# Patient Record
Sex: Female | Born: 2000 | Race: Black or African American | Hispanic: No | Marital: Single | Smoking: Never smoker
Health system: Southern US, Community
[De-identification: ages and names within clinical notes are randomized; demographics above are authoritative.]

## PROBLEM LIST (undated history)

## (undated) DIAGNOSIS — R519 Headache, unspecified: Secondary | ICD-10-CM

## (undated) HISTORY — DX: Headache, unspecified: R51.9

## (undated) HISTORY — PX: NO PAST SURGERIES: SHX2092

---
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
## 2022-01-17 ENCOUNTER — Other Ambulatory Visit: Payer: Self-pay | Admitting: Advanced Practice Midwife

## 2022-01-22 ENCOUNTER — Telehealth: Payer: Self-pay

## 2022-01-22 NOTE — Telephone Encounter (Signed)
Pt called and wanted to see if she can get a refill on her Megestrol

## 2022-01-23 ENCOUNTER — Encounter: Payer: Self-pay | Admitting: Adult Health

## 2022-01-23 ENCOUNTER — Ambulatory Visit: Payer: Medicaid Other | Admitting: Adult Health

## 2022-01-23 VITALS — BP 123/79 | HR 89 | Ht 63.0 in | Wt 216.8 lb

## 2022-01-23 DIAGNOSIS — N926 Irregular menstruation, unspecified: Secondary | ICD-10-CM

## 2022-01-23 DIAGNOSIS — Z975 Presence of (intrauterine) contraceptive device: Secondary | ICD-10-CM | POA: Diagnosis not present

## 2022-01-23 DIAGNOSIS — Z113 Encounter for screening for infections with a predominantly sexual mode of transmission: Secondary | ICD-10-CM | POA: Diagnosis not present

## 2022-01-23 MED ORDER — MEGESTROL ACETATE 40 MG PO TABS: ORAL_TABLET | ORAL | 3 refills | Status: DC

## 2022-01-23 NOTE — Progress Notes (Signed)
  Subjective:     Patient ID: Catherine Doyle, female   DOB: [birth year], 21 y.o.   MRN: [id number]  HPI Memorie is a 21 year old black female,single, G1P1 in complaining of irregular bleeding with nexplanon and request refill on megace. PCP is [facility].   Review of Systems Having irregular bleeding with nexplanon No pain with sex  Reviewed past medical,surgical, social and family history. Reviewed medications and allergies.     Objective:   Physical Exam BP 123/79 (BP Location: Right Arm, Patient Position: Sitting, Cuff Size: Normal)   Pulse 89   Ht 5\' 3"  (1.6 m)   Wt 216 lb 12.8 oz (98.3 kg)   Breastfeeding No   BMI 38.40 kg/m     Skin warm and dry. Lungs: clear to ausculation bilaterally. Cardiovascular: regular rate and rhythm.   Upstream - 01/23/22 1059       Pregnancy Intention Screening   Does the patient want to become pregnant in the next year? No    Does the patient's partner want to become pregnant in the next year? No    Would the patient like to discuss contraceptive options today? No      Contraception Wrap Up   Current Method Hormonal Implant    End Method Hormonal Implant    Contraception Counseling Provided No             Assessment:     1. Screen for STD (sexually transmitted disease) Urine sent for GC/CHL  2. Irregular bleeding Will refill megace  Meds ordered this encounter  Medications   megestrol (MEGACE) 40 MG tablet    Sig: Take 3/day (at the same time) for 5 days; 2/day for 5 days, then 1/day PO prn bleeding    Dispense:  60 tablet    Refill:  3    Order Specific Question:   Supervising Provider    Answer:   01/25/22, LUTHER H [[id number]]     3. Nexplanon in place Placed 04/21/20    Plan:     Follow up prn

## 2022-01-25 LAB — GC/CHLAMYDIA PROBE AMP
Chlamydia trachomatis, NAA: NEGATIVE
Neisseria Gonorrhoeae by PCR: NEGATIVE

## 2022-10-19 ENCOUNTER — Encounter (HOSPITAL_COMMUNITY): Payer: Self-pay | Admitting: *Deleted

## 2022-10-19 ENCOUNTER — Other Ambulatory Visit: Payer: Self-pay

## 2022-10-19 ENCOUNTER — Emergency Department (HOSPITAL_COMMUNITY): Payer: Medicaid Other

## 2022-10-19 ENCOUNTER — Emergency Department (HOSPITAL_COMMUNITY)
Admission: EM | Admit: 2022-10-19 | Discharge: 2022-10-19 | Disposition: A | Discharge status: Home / Self Care | Payer: Medicaid Other | Attending: Emergency Medicine | Admitting: Emergency Medicine

## 2022-10-19 DIAGNOSIS — Y92410 Unspecified street and highway as the place of occurrence of the external cause: Secondary | ICD-10-CM | POA: Insufficient documentation

## 2022-10-19 DIAGNOSIS — Y907 Blood alcohol level of 200-239 mg/100 ml: Secondary | ICD-10-CM | POA: Insufficient documentation

## 2022-10-19 DIAGNOSIS — R109 Unspecified abdominal pain: Secondary | ICD-10-CM | POA: Insufficient documentation

## 2022-10-19 DIAGNOSIS — M25522 Pain in left elbow: Secondary | ICD-10-CM | POA: Insufficient documentation

## 2022-10-19 DIAGNOSIS — R0781 Pleurodynia: Secondary | ICD-10-CM | POA: Diagnosis not present

## 2022-10-19 DIAGNOSIS — S0990XA Unspecified injury of head, initial encounter: Secondary | ICD-10-CM | POA: Diagnosis present

## 2022-10-19 DIAGNOSIS — Z23 Encounter for immunization: Secondary | ICD-10-CM | POA: Diagnosis not present

## 2022-10-19 DIAGNOSIS — E876 Hypokalemia: Secondary | ICD-10-CM | POA: Insufficient documentation

## 2022-10-19 DIAGNOSIS — M549 Dorsalgia, unspecified: Secondary | ICD-10-CM | POA: Diagnosis not present

## 2022-10-19 DIAGNOSIS — S0100XA Unspecified open wound of scalp, initial encounter: Secondary | ICD-10-CM | POA: Diagnosis not present

## 2022-10-19 LAB — COMPREHENSIVE METABOLIC PANEL
ALT: 76 U/L — ABNORMAL HIGH (ref 0–44)
AST: 118 U/L — ABNORMAL HIGH (ref 15–41)
Albumin: 3.8 g/dL (ref 3.5–5.0)
Alkaline Phosphatase: 85 U/L (ref 38–126)
Anion gap: 12 (ref 5–15)
BUN: 5 mg/dL — ABNORMAL LOW (ref 6–20)
CO2: 20 mmol/L — ABNORMAL LOW (ref 22–32)
Calcium: 9.1 mg/dL (ref 8.9–10.3)
Chloride: 104 mmol/L (ref 98–111)
Creatinine, Ser: 1.03 mg/dL — ABNORMAL HIGH (ref 0.44–1.00)
GFR, Estimated: >60 mL/min (ref >60)
Glucose, Bld: 135 mg/dL — ABNORMAL HIGH (ref 70–99)
Potassium: 3.1 mmol/L — ABNORMAL LOW (ref 3.5–5.1)
Sodium: 136 mmol/L (ref 135–145)
Total Bilirubin: 0.4 mg/dL (ref 0.3–1.2)
Total Protein: 8.3 g/dL — ABNORMAL HIGH (ref 6.5–8.1)

## 2022-10-19 LAB — URINALYSIS, ROUTINE W REFLEX MICROSCOPIC
Bacteria, UA: NONE SEEN
Bilirubin Urine: NEGATIVE
Glucose, UA: NEGATIVE mg/dL
Ketones, ur: NEGATIVE mg/dL
Leukocytes,Ua: NEGATIVE
Nitrite: NEGATIVE
Protein, ur: NEGATIVE mg/dL
Specific Gravity, Urine: 1.002 — ABNORMAL LOW (ref 1.005–1.030)
pH: 6 (ref 5.0–8.0)

## 2022-10-19 LAB — I-STAT CHEM 8, ED
BUN: 5 mg/dL — ABNORMAL LOW (ref 6–20)
Calcium, Ion: 1.01 mmol/L — ABNORMAL LOW (ref 1.15–1.40)
Chloride: 105 mmol/L (ref 98–111)
Creatinine, Ser: 1.3 mg/dL — ABNORMAL HIGH (ref 0.44–1.00)
Glucose, Bld: 139 mg/dL — ABNORMAL HIGH (ref 70–99)
HCT: 42 % (ref 36.0–46.0)
Hemoglobin: 14.3 g/dL (ref 12.0–15.0)
Potassium: 3.1 mmol/L — ABNORMAL LOW (ref 3.5–5.1)
Sodium: 140 mmol/L (ref 135–145)
TCO2: 21 mmol/L — ABNORMAL LOW (ref 22–32)

## 2022-10-19 LAB — CBC
HCT: 41.4 % (ref 36.0–46.0)
Hemoglobin: 14.4 g/dL (ref 12.0–15.0)
MCH: 26.4 pg (ref 26.0–34.0)
MCHC: 34.8 g/dL (ref 30.0–36.0)
MCV: 76 fL — ABNORMAL LOW (ref 80.0–100.0)
Platelets: 364 10*3/uL (ref 150–400)
RBC: 5.45 MIL/uL — ABNORMAL HIGH (ref 3.87–5.11)
RDW: 16.1 % — ABNORMAL HIGH (ref 11.5–15.5)
WBC: 11.9 10*3/uL — ABNORMAL HIGH (ref 4.0–10.5)
nRBC: 0 % (ref 0.0–0.2)

## 2022-10-19 LAB — SAMPLE TO BLOOD BANK

## 2022-10-19 LAB — PROTIME-INR
INR: 1 (ref 0.8–1.2)
Prothrombin Time: 13 seconds (ref 11.4–15.2)

## 2022-10-19 LAB — ETHANOL: Alcohol, Ethyl (B): 233 mg/dL — ABNORMAL HIGH (ref <10)

## 2022-10-19 LAB — LACTIC ACID, PLASMA
Lactic Acid, Venous: 3 mmol/L (ref 0.5–1.9)
Lactic Acid, Venous: 2.5 mmol/L (ref 0.5–1.9)

## 2022-10-19 LAB — I-STAT BETA HCG BLOOD, ED (MC, WL, AP ONLY): I-stat hCG, quantitative: <5 m[IU]/mL (ref <5)

## 2022-10-19 MED ORDER — IOHEXOL 350 MG/ML SOLN
75.0000 mL | Freq: Once | INTRAVENOUS | Status: AC | PRN
  Administered 2022-10-19: 75 mL via INTRAVENOUS

## 2022-10-19 MED ORDER — ONDANSETRON HCL 4 MG/2ML IJ SOLN
4.0000 mg | Freq: Once | INTRAMUSCULAR | Status: AC
  Administered 2022-10-19: 4 mg via INTRAVENOUS
  Filled 2022-10-19: qty 2

## 2022-10-19 MED ORDER — IBUPROFEN 800 MG PO TABS: 800.0000 mg | ORAL_TABLET | Freq: Three times a day (TID) | ORAL | 0 refills | Status: AC

## 2022-10-19 MED ORDER — CYCLOBENZAPRINE HCL 10 MG PO TABS: 10.0000 mg | ORAL_TABLET | Freq: Three times a day (TID) | ORAL | 0 refills | Status: AC | PRN

## 2022-10-19 MED ORDER — LACTATED RINGERS IV BOLUS
1000.0000 mL | Freq: Once | INTRAVENOUS | Status: AC
  Administered 2022-10-19: 1000 mL via INTRAVENOUS

## 2022-10-19 MED ORDER — TETANUS-DIPHTH-ACELL PERTUSSIS 5-2.5-18.5 LF-MCG/0.5 IM SUSY
0.5000 mL | PREFILLED_SYRINGE | Freq: Once | INTRAMUSCULAR | Status: AC
  Administered 2022-10-19: 0.5 mL via INTRAMUSCULAR
  Filled 2022-10-19: qty 0.5

## 2022-10-19 MED ORDER — KETOROLAC TROMETHAMINE 15 MG/ML IJ SOLN
15.0000 mg | Freq: Once | INTRAMUSCULAR | Status: AC
  Administered 2022-10-19: 15 mg via INTRAVENOUS
  Filled 2022-10-19: qty 1

## 2022-10-19 MED ORDER — HYDROMORPHONE HCL 1 MG/ML IJ SOLN
0.5000 mg | Freq: Once | INTRAMUSCULAR | Status: AC
  Administered 2022-10-19: 0.5 mg via INTRAVENOUS
  Filled 2022-10-19: qty 1

## 2022-10-19 MED ORDER — POTASSIUM CHLORIDE CRYS ER 20 MEQ PO TBCR
40.0000 meq | EXTENDED_RELEASE_TABLET | Freq: Once | ORAL | Status: AC
  Administered 2022-10-19: 40 meq via ORAL
  Filled 2022-10-19: qty 2

## 2022-10-19 MED ORDER — LIDOCAINE 5 % EX PTCH: 1.0000 | MEDICATED_PATCH | CUTANEOUS | 0 refills | Status: AC
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
## 2022-10-19 NOTE — ED Notes (Signed)
The pt is c/o her rt arm hurting  she is crying hysterically mother attempting to calm her down  she keeps wanting her 22 yr old child  her father has the child
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
## 2022-10-19 NOTE — ED Notes (Signed)
Wound care performed

## 2022-10-19 NOTE — ED Notes (Signed)
Pt ambulated to bathroom at this time.

## 2022-10-19 NOTE — ED Notes (Signed)
Pt back from CT at this time 

## 2022-10-19 NOTE — ED Notes (Signed)
AVS reviewed with pt and pt's mother prior to discharge. Pt verbalizes understanding. Pt provided with paper scrubs. Belongings with pt upon depart. Ambulatory to POV with mother driving home.

## 2022-10-19 NOTE — Progress Notes (Signed)
Orthopedic Tech Progress Note Patient Details:  Catherine Doyle 2001-04-01 [id number]  Patient ID: Catherine Doyle, female   DOB: [birth year], 22 y.o.   MRN: [id number] I attended trauma page Catherine Doyle 10/19/2022, 6:49 AM
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
## 2022-11-11 ENCOUNTER — Ambulatory Visit: Payer: Medicaid Other | Admitting: Physician Assistant

## 2022-12-17 ENCOUNTER — Other Ambulatory Visit: Payer: Medicaid Other

## 2022-12-18 ENCOUNTER — Other Ambulatory Visit (INDEPENDENT_AMBULATORY_CARE_PROVIDER_SITE_OTHER): Payer: Medicaid Other

## 2022-12-18 ENCOUNTER — Other Ambulatory Visit (HOSPITAL_COMMUNITY)
Admission: RE | Admit: 2022-12-18 | Discharge: 2022-12-18 | Disposition: A | Discharge status: Home / Self Care | Payer: Medicaid Other | Source: Ambulatory Visit | Attending: Obstetrics & Gynecology | Admitting: Obstetrics & Gynecology

## 2022-12-18 DIAGNOSIS — N898 Other specified noninflammatory disorders of vagina: Secondary | ICD-10-CM | POA: Insufficient documentation

## 2022-12-18 NOTE — Progress Notes (Signed)
   NURSE VISIT- VAGINITIS/STD/POC  SUBJECTIVE:  Catherine Doyle is a 22 y.o. G1P1001 GYN patientfemale here for a vaginal swab for vaginitis screening.  She reports the following symptoms: local irritation, odor, and vulvar itching for 2 days. States she is itching a lot and wants Diflucan sent in while she waits for test to come back.  Denies abnormal vaginal bleeding, significant pelvic pain, fever, or UTI symptoms.  OBJECTIVE:  There were no vitals taken for this visit.  Appears well, in no apparent distress  ASSESSMENT: Vaginal swab for vaginitis screening  PLAN: Self-collected vaginal probe for Gonorrhea, Chlamydia, Trichomonas, Bacterial Vaginosis, Yeast sent to lab Treatment: to be determined once results are received Follow-up as needed if symptoms persist/worsen, or new symptoms develop Requesting Diflucan   Jobe Marker  12/18/2022 4:08 PM

## 2022-12-19 ENCOUNTER — Telehealth: Payer: Self-pay

## 2022-12-19 NOTE — Telephone Encounter (Signed)
Pt aware swab is not back yet. If results aren't back by the time we close today, JAG will see results on Monday. Pt voiced understanding. JSY

## 2022-12-19 NOTE — Telephone Encounter (Signed)
Patient called and stated that Victorino Dike was suppose to call her something in for a yeast infection but, there is nothing at the pharmacy.

## 2022-12-22 ENCOUNTER — Other Ambulatory Visit: Payer: Self-pay | Admitting: Adult Health

## 2022-12-22 LAB — CERVICOVAGINAL ANCILLARY ONLY
Bacterial Vaginitis (gardnerella): POSITIVE — AB
Candida Glabrata: NEGATIVE
Candida Vaginitis: POSITIVE — AB
Chlamydia: NEGATIVE
Comment: NEGATIVE
Comment: NEGATIVE
Comment: NEGATIVE
Comment: NEGATIVE
Comment: NEGATIVE
Comment: NORMAL
Neisseria Gonorrhea: NEGATIVE
Trichomonas: NEGATIVE

## 2022-12-22 MED ORDER — METRONIDAZOLE 500 MG PO TABS: 500.0000 mg | ORAL_TABLET | Freq: Two times a day (BID) | ORAL | 0 refills | Status: DC

## 2022-12-22 MED ORDER — FLUCONAZOLE 150 MG PO TABS: ORAL_TABLET | ORAL | 1 refills | Status: DC

## 2022-12-22 NOTE — Progress Notes (Signed)
+  yeast and BV on vaginal swab will rx diflucan and flagyl, no sex or alcohol while taking meds

## 2022-12-25 ENCOUNTER — Telehealth: Payer: Self-pay | Admitting: Adult Health

## 2022-12-25 NOTE — Telephone Encounter (Signed)
Patent wants to know if you can send in rx for the gel. Patient states she can't take pill. Please advise.

## 2022-12-26 MED ORDER — METRONIDAZOLE 0.75 % VA GEL: 1.0000 | Freq: Every day | VAGINAL | 0 refills | Status: AC

## 2022-12-26 NOTE — Addendum Note (Signed)
Addended by: Cyril Mourning A on: 12/26/2022 11:46 AM   Modules accepted: Orders

## 2022-12-26 NOTE — Telephone Encounter (Signed)
Rx metrogel  

## 2022-12-26 NOTE — Telephone Encounter (Signed)
Pt is requesting the gel in place of the Flagyl pill. Thanks! JSY

## 2023-05-07 ENCOUNTER — Ambulatory Visit: Payer: Medicaid Other | Admitting: Adult Health

## 2023-05-12 ENCOUNTER — Ambulatory Visit: Payer: Medicaid Other | Admitting: Adult Health

## 2023-09-23 ENCOUNTER — Other Ambulatory Visit: Payer: Self-pay | Admitting: Adult Health

## 2023-09-23 ENCOUNTER — Other Ambulatory Visit: Payer: Medicaid Other | Admitting: *Deleted

## 2023-09-23 ENCOUNTER — Other Ambulatory Visit (HOSPITAL_COMMUNITY)
Admission: RE | Admit: 2023-09-23 | Discharge: 2023-09-23 | Disposition: A | Discharge status: Home / Self Care | Payer: Medicaid Other | Source: Ambulatory Visit | Attending: Obstetrics & Gynecology | Admitting: Obstetrics & Gynecology

## 2023-09-23 DIAGNOSIS — N898 Other specified noninflammatory disorders of vagina: Secondary | ICD-10-CM | POA: Diagnosis present

## 2023-09-23 MED ORDER — MEGESTROL ACETATE 40 MG PO TABS: ORAL_TABLET | ORAL | 1 refills | Status: AC

## 2023-09-23 NOTE — Progress Notes (Signed)
 Refilled megace

## 2023-09-23 NOTE — Progress Notes (Signed)
   NURSE VISIT- VAGINITIS/STD/POC  SUBJECTIVE:  CECE MILHOUSE is a 23 y.o. G1P1001 GYN patientfemale here for a vaginal swab for vaginitis screening, STD screen.  She reports the following symptoms: vulvar itching for several  days. Denies abnormal vaginal bleeding, significant pelvic pain, fever, or UTI symptoms.  OBJECTIVE:  There were no vitals taken for this visit.  Appears well, in no apparent distress  ASSESSMENT: Vaginal swab for vaginitis screening  PLAN: Self-collected vaginal probe for Gonorrhea, Chlamydia, Trichomonas, Bacterial Vaginosis, Yeast sent to lab Treatment: to be determined once results are received Follow-up as needed if symptoms persist/worsen, or new symptoms develop   Patient also requesting refill on Megestrol for bleeding on nexplanon. Advised to check pharmacy later.  Annamarie Dawley  09/23/2023 10:32 AM

## 2023-09-24 LAB — CERVICOVAGINAL ANCILLARY ONLY
Bacterial Vaginitis (gardnerella): NEGATIVE
Candida Glabrata: POSITIVE — AB
Candida Vaginitis: NEGATIVE
Chlamydia: NEGATIVE
Comment: NEGATIVE
Comment: NEGATIVE
Comment: NEGATIVE
Comment: NEGATIVE
Comment: NEGATIVE
Comment: NORMAL
Neisseria Gonorrhea: NEGATIVE
Trichomonas: NEGATIVE

## 2023-09-25 ENCOUNTER — Other Ambulatory Visit: Payer: Self-pay | Admitting: Adult Health

## 2023-09-25 MED ORDER — FLUCONAZOLE 150 MG PO TABS: ORAL_TABLET | ORAL | 1 refills | Status: AC

## 2023-09-25 NOTE — Progress Notes (Signed)
+  candida glabrata on vaginal swab, rx sent in for diflucan

## 2024-01-26 ENCOUNTER — Ambulatory Visit (INDEPENDENT_AMBULATORY_CARE_PROVIDER_SITE_OTHER): Admitting: Women's Health

## 2024-01-26 ENCOUNTER — Other Ambulatory Visit (HOSPITAL_COMMUNITY)
Admission: RE | Admit: 2024-01-26 | Discharge: 2024-01-26 | Disposition: A | Discharge status: Home / Self Care | Source: Ambulatory Visit | Attending: Women's Health | Admitting: Women's Health

## 2024-01-26 ENCOUNTER — Encounter: Payer: Self-pay | Admitting: Women's Health

## 2024-01-26 VITALS — BP 140/87 | HR 99 | Ht 65.0 in | Wt 269.0 lb

## 2024-01-26 DIAGNOSIS — Z113 Encounter for screening for infections with a predominantly sexual mode of transmission: Secondary | ICD-10-CM | POA: Diagnosis present

## 2024-01-26 DIAGNOSIS — Z3046 Encounter for surveillance of implantable subdermal contraceptive: Secondary | ICD-10-CM

## 2024-01-26 DIAGNOSIS — Z3202 Encounter for pregnancy test, result negative: Secondary | ICD-10-CM

## 2024-01-26 DIAGNOSIS — Z30017 Encounter for initial prescription of implantable subdermal contraceptive: Secondary | ICD-10-CM

## 2024-01-26 LAB — POCT URINE PREGNANCY: Preg Test, Ur: NEGATIVE

## 2024-01-26 MED ORDER — ETONOGESTREL 68 MG ~~LOC~~ IMPL
68.0000 mg | DRUG_IMPLANT | Freq: Once | SUBCUTANEOUS | Status: AC
  Administered 2024-01-26: 68 mg via SUBCUTANEOUS

## 2024-01-26 NOTE — Progress Notes (Signed)
 NEXPLANON  REMOVAL AND RE-INSERTION Patient name: Catherine Doyle MRN [id number]  Date of birth: 05-Jun-2001 Subjective Findings:   Catherine Doyle is a 23 y.o. G87P1001 African American female being seen today for Nexplanon  removal and re-insertion. Her Nexplanon  was placed 04/21/20.  Wants testing for STD/bv/yeast, no sx  No LMP recorded. Patient has had an implant. Last pap<21yo. Results were: N/A  Risks/benefits/side effects of Nexplanon  have been discussed and her questions have been answered.  Specifically, a failure rate of 08/998 has been reported, with an increased failure rate if pt takes St. John's Wort and/or antiseizure medicaitons.  She is aware of the common side effect of irregular bleeding, which the incidence of decreases over time. Signed copy of informed consent in chart.      01/27/2020    9:00 AM 11/18/2019   11:41 AM  Depression screen PHQ 2/9  Decreased Interest 1 1  Down, Depressed, Hopeless 0 0  PHQ - 2 Score 1 1  Altered sleeping 0 2  Tired, decreased energy 1 2  Change in appetite 0 0  Feeling bad or failure about yourself  0 0  Trouble concentrating 0 0  Moving slowly or fidgety/restless 0 0  Suicidal thoughts 0 0  PHQ-9 Score 2 5  Difficult doing work/chores Not difficult at all Somewhat difficult        01/27/2020    9:01 AM 11/18/2019   11:42 AM  GAD 7 : Generalized Anxiety Score  Nervous, Anxious, on Edge 0 0  Control/stop worrying 0 0  Worry too much - different things 0 0  Trouble relaxing 1 0  Restless 0 0  Easily annoyed or irritable 1 1  Afraid - awful might happen 0 0  Total GAD 7 Score 2 1  Anxiety Difficulty Not difficult at all Somewhat difficult     Pertinent History Reviewed:   Reviewed past medical,surgical, social, obstetrical and family history.  Reviewed problem list, medications and allergies. Objective Findings & Procedure:    Vitals:   01/26/24 0831  BP: (!) 140/87  Pulse: 99  Weight: 269 lb (122 kg)  Height: 5' 5  (1.651 m)  Body mass index is 44.76 kg/m.  No results found for this or any previous visit (from the past 24 hours).   Time out was performed.  Nexplanon  site identified.  Area prepped in usual sterile fashon. Two cc's of 2% lidocaine  was used to anesthetize the area. A small stab incision was made right beside the implant on the distal portion.  The Nexplanon  rod was grasped using hemostats and removed intact without difficulty.  The area was cleansed again with betadine and the Nexplanon  was inserted approximately 10cm from the medial epicondyle and 3-5cm posterior to the sulcus per manufacturer's recommendations without difficulty.  Steri-strips and a pressure bandage was applied.  There was less than 3 cc blood loss. There were no complications.  The patient tolerated the procedure well. Assessment & Plan:   1) Nexplanon  removal & re-insertion She was instructed to keep the area clean and dry, remove pressure bandage in 24 hours, and keep insertion site covered with the steri-strips for 3-5 days.  She was given a card indicating date Nexplanon  was inserted and date it needs to be removed.  Follow-up PRN problems.  2) STD screen> self collected CV swab  3) Elevated bp> will repeat next month at pap visit  Orders Placed This Encounter  Procedures   POCT urine pregnancy    Follow-up: Return  for after 7/28 for pap & physical.  Suzen JONELLE Fetters CNM, [facility] 01/26/2024 9:08 AM

## 2024-01-26 NOTE — Patient Instructions (Signed)
Keep the area clean and dry.  You can remove the big bandage in 24 hours, and the small steri-strip bandage in 3-5 days.  A back up method, such as condoms, should be used for two weeks. You may have irregular vaginal bleeding for the first 6 months after the Nexplanon is placed, then the bleeding usually lightens and it is possible that you may not have any periods.  If you have any concerns, please give us a call.   

## 2024-01-26 NOTE — Addendum Note (Signed)
 Addended by: SANNA GONG A on: 01/26/2024 09:36 AM   Modules accepted: Orders

## 2024-01-27 LAB — CERVICOVAGINAL ANCILLARY ONLY
Bacterial Vaginitis (gardnerella): NEGATIVE
Candida Glabrata: NEGATIVE
Candida Vaginitis: NEGATIVE
Chlamydia: NEGATIVE
Comment: NEGATIVE
Comment: NEGATIVE
Comment: NEGATIVE
Comment: NEGATIVE
Comment: NEGATIVE
Comment: NORMAL
Neisseria Gonorrhea: NEGATIVE
Trichomonas: NEGATIVE

## 2024-01-28 ENCOUNTER — Ambulatory Visit: Payer: Self-pay | Admitting: Women's Health

## 2024-03-14 ENCOUNTER — Ambulatory Visit: Admitting: Women's Health

## 2024-04-06 ENCOUNTER — Ambulatory Visit

## 2024-04-06 ENCOUNTER — Other Ambulatory Visit (HOSPITAL_COMMUNITY)
Admission: RE | Admit: 2024-04-06 | Discharge: 2024-04-06 | Disposition: A | Discharge status: Home / Self Care | Source: Ambulatory Visit | Attending: Obstetrics & Gynecology | Admitting: Obstetrics & Gynecology

## 2024-04-06 DIAGNOSIS — Z113 Encounter for screening for infections with a predominantly sexual mode of transmission: Secondary | ICD-10-CM | POA: Diagnosis present

## 2024-04-06 DIAGNOSIS — N898 Other specified noninflammatory disorders of vagina: Secondary | ICD-10-CM | POA: Diagnosis present

## 2024-04-06 NOTE — Progress Notes (Signed)
   NURSE VISIT- VAGINITIS/STD  SUBJECTIVE:  Catherine Doyle is a 23 y.o. G1P1001 GYN patientfemale here for a vaginal swab for vaginitis screening.  She reports the following symptoms: odor and vulvar itching and wants to get ahead it because she's had worse. Denies abnormal vaginal bleeding, significant pelvic pain, fever, or UTI symptoms.  OBJECTIVE:  There were no vitals taken for this visit.  Appears well, in no apparent distress  ASSESSMENT: Vaginal swab for vaginitis screening  PLAN: Self-collected vaginal probe for Gonorrhea, Chlamydia, Trichomonas, Bacterial Vaginosis, Yeast sent to lab Treatment: to be determined once results are received Follow-up as needed if symptoms persist/worsen, or new symptoms develop  Aleck FORBES Blase  04/06/2024 2:52 PM

## 2024-04-08 LAB — CERVICOVAGINAL ANCILLARY ONLY
Bacterial Vaginitis (gardnerella): NEGATIVE
Candida Glabrata: NEGATIVE
Candida Vaginitis: NEGATIVE
Chlamydia: NEGATIVE
Comment: NEGATIVE
Comment: NEGATIVE
Comment: NEGATIVE
Comment: NEGATIVE
Comment: NEGATIVE
Comment: NORMAL
Neisseria Gonorrhea: NEGATIVE
Trichomonas: POSITIVE — AB

## 2024-04-09 ENCOUNTER — Ambulatory Visit: Payer: Self-pay | Admitting: Obstetrics and Gynecology

## 2024-04-09 DIAGNOSIS — A599 Trichomoniasis, unspecified: Secondary | ICD-10-CM | POA: Insufficient documentation

## 2024-04-09 MED ORDER — METRONIDAZOLE 500 MG PO TABS: 500.0000 mg | ORAL_TABLET | Freq: Two times a day (BID) | ORAL | 0 refills | Status: AC

## 2024-05-10 ENCOUNTER — Ambulatory Visit: Admitting: Obstetrics & Gynecology

## 2024-08-15 ENCOUNTER — Other Ambulatory Visit (HOSPITAL_COMMUNITY)
Admission: RE | Admit: 2024-08-15 | Discharge: 2024-08-15 | Disposition: A | Discharge status: Home / Self Care | Source: Ambulatory Visit | Attending: Obstetrics & Gynecology | Admitting: Obstetrics & Gynecology

## 2024-08-15 ENCOUNTER — Ambulatory Visit

## 2024-08-15 DIAGNOSIS — N898 Other specified noninflammatory disorders of vagina: Secondary | ICD-10-CM | POA: Diagnosis present

## 2024-08-15 NOTE — Progress Notes (Signed)
" ° °  NURSE VISIT- VAGINITIS/STD  SUBJECTIVE:  Catherine Doyle is a 24 y.o. G1P1001 GYN patientfemale here for a vaginal swab for vaginitis screening.  She reports the following symptoms: vaginal discharge, odor for 3 days. Denies abnormal vaginal bleeding, significant pelvic pain, fever, or UTI symptoms. Denies new sexual partners.  OBJECTIVE:  There were no vitals taken for this visit.  Appears well, in no apparent distress  ASSESSMENT: Vaginal swab for vaginitis screening  PLAN: Self-collected vaginal probe for Gonorrhea, Chlamydia, Trichomonas, Bacterial Vaginosis, Yeast sent to lab Treatment: to be determined once results are received Follow-up as needed if symptoms persist/worsen, or new symptoms develop  Aleck FORBES Blase  08/15/2024 2:37 PM  "

## 2024-08-17 ENCOUNTER — Ambulatory Visit: Payer: Self-pay | Admitting: Adult Health

## 2024-08-17 LAB — CERVICOVAGINAL ANCILLARY ONLY
Bacterial Vaginitis (gardnerella): NEGATIVE
Candida Glabrata: NEGATIVE
Candida Vaginitis: NEGATIVE
Chlamydia: NEGATIVE
Comment: NEGATIVE
Comment: NEGATIVE
Comment: NEGATIVE
Comment: NEGATIVE
Comment: NEGATIVE
Comment: NORMAL
Neisseria Gonorrhea: NEGATIVE
Trichomonas: NEGATIVE
